# Patient Record
Sex: Male | Born: 1950 | Race: White | Hispanic: No | Smoking: Never smoker
Health system: Southern US, Community
[De-identification: ages and names within clinical notes are randomized; demographics above are authoritative.]

## PROBLEM LIST (undated history)

## (undated) DIAGNOSIS — S6291XA Unspecified fracture of right wrist and hand, initial encounter for closed fracture: Secondary | ICD-10-CM

## (undated) DIAGNOSIS — C801 Malignant (primary) neoplasm, unspecified: Secondary | ICD-10-CM

## (undated) DIAGNOSIS — I10 Essential (primary) hypertension: Secondary | ICD-10-CM

## (undated) DIAGNOSIS — Z8659 Personal history of other mental and behavioral disorders: Secondary | ICD-10-CM

## (undated) DIAGNOSIS — F319 Bipolar disorder, unspecified: Secondary | ICD-10-CM

## (undated) DIAGNOSIS — Z940 Kidney transplant status: Secondary | ICD-10-CM

## (undated) DIAGNOSIS — N19 Unspecified kidney failure: Secondary | ICD-10-CM

## (undated) HISTORY — PX: COLONOSCOPY: SHX174

## (undated) HISTORY — PX: SKIN CANCER EXCISION: SHX779

## (undated) HISTORY — PX: PARATHYROIDECTOMY: SHX19

---

## 2011-05-06 HISTORY — PX: KIDNEY TRANSPLANT: SHX239

## 2019-06-06 DEATH — deceased

## 2019-07-17 ENCOUNTER — Emergency Department (HOSPITAL_COMMUNITY): Payer: No Typology Code available for payment source

## 2019-07-17 ENCOUNTER — Emergency Department (HOSPITAL_COMMUNITY)
Admission: EM | Admit: 2019-07-17 | Discharge: 2019-07-17 | Disposition: A | Discharge status: Home / Self Care | Payer: No Typology Code available for payment source | Attending: Emergency Medicine | Admitting: Emergency Medicine

## 2019-07-17 DIAGNOSIS — S40212A Abrasion of left shoulder, initial encounter: Secondary | ICD-10-CM | POA: Insufficient documentation

## 2019-07-17 DIAGNOSIS — Y92410 Unspecified street and highway as the place of occurrence of the external cause: Secondary | ICD-10-CM | POA: Diagnosis not present

## 2019-07-17 DIAGNOSIS — Z23 Encounter for immunization: Secondary | ICD-10-CM | POA: Diagnosis not present

## 2019-07-17 DIAGNOSIS — S0081XA Abrasion of other part of head, initial encounter: Secondary | ICD-10-CM | POA: Diagnosis not present

## 2019-07-17 DIAGNOSIS — S62306A Unspecified fracture of fifth metacarpal bone, right hand, initial encounter for closed fracture: Secondary | ICD-10-CM | POA: Diagnosis not present

## 2019-07-17 DIAGNOSIS — S0031XA Abrasion of nose, initial encounter: Secondary | ICD-10-CM | POA: Diagnosis not present

## 2019-07-17 DIAGNOSIS — R519 Headache, unspecified: Secondary | ICD-10-CM | POA: Insufficient documentation

## 2019-07-17 DIAGNOSIS — S52501A Unspecified fracture of the lower end of right radius, initial encounter for closed fracture: Secondary | ICD-10-CM

## 2019-07-17 DIAGNOSIS — Y939 Activity, unspecified: Secondary | ICD-10-CM | POA: Diagnosis not present

## 2019-07-17 DIAGNOSIS — S6991XA Unspecified injury of right wrist, hand and finger(s), initial encounter: Secondary | ICD-10-CM | POA: Diagnosis present

## 2019-07-17 DIAGNOSIS — Y999 Unspecified external cause status: Secondary | ICD-10-CM | POA: Insufficient documentation

## 2019-07-17 DIAGNOSIS — S62304A Unspecified fracture of fourth metacarpal bone, right hand, initial encounter for closed fracture: Secondary | ICD-10-CM | POA: Diagnosis not present

## 2019-07-17 HISTORY — DX: Rider (driver) (passenger) of other motorcycle injured in unspecified traffic accident, initial encounter: V29.99XA

## 2019-07-17 MED ORDER — OXYCODONE-ACETAMINOPHEN 5-325 MG PO TABS
1.0000 | ORAL_TABLET | Freq: Once | ORAL | Status: AC
  Administered 2019-07-17: 1 via ORAL
  Filled 2019-07-17: qty 1

## 2019-07-17 MED ORDER — TETANUS-DIPHTH-ACELL PERTUSSIS 5-2.5-18.5 LF-MCG/0.5 IM SUSP
0.5000 mL | Freq: Once | INTRAMUSCULAR | Status: AC
  Administered 2019-07-17: 0.5 mL via INTRAMUSCULAR
  Filled 2019-07-17: qty 0.5

## 2019-07-17 MED ORDER — OXYCODONE-ACETAMINOPHEN 5-325 MG PO TABS: 1.0000 | ORAL_TABLET | Freq: Four times a day (QID) | ORAL | 0 refills | Status: DC | PRN

## 2019-07-17 MED ORDER — FENTANYL CITRATE (PF) 100 MCG/2ML IJ SOLN
100.0000 ug | Freq: Once | INTRAMUSCULAR | Status: AC
  Administered 2019-07-17: 100 ug via INTRAVENOUS
  Filled 2019-07-17: qty 2

## 2019-07-17 MED ORDER — HYDROCODONE-ACETAMINOPHEN 5-325 MG PO TABS
2.0000 | ORAL_TABLET | Freq: Once | ORAL | Status: DC
  Filled 2019-07-17: qty 2

## 2019-07-17 MED ORDER — BACITRACIN ZINC 500 UNIT/GM EX OINT
1.0000 "application " | TOPICAL_OINTMENT | Freq: Two times a day (BID) | CUTANEOUS | Status: DC
  Administered 2019-07-17: 1 via TOPICAL
  Filled 2019-07-17: qty 3.6
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
## 2019-07-17 NOTE — Progress Notes (Signed)
Orthopedic Tech Progress Note Patient Details:  Bradley Vargas 31-Dec-1950 [id number]  Ortho Devices Type of Ortho Device: Ulna gutter splint Ortho Device/Splint Location: LUE Ortho Device/Splint Interventions: Application, Ordered   Post Interventions Patient Tolerated: Well Instructions Provided: Care of device, Adjustment of device   Janit Pagan 07/17/2019, 7:32 PM

## 2019-07-17 NOTE — ED Notes (Signed)
Spoke with ortho tech. Reports they are at [facility] and should be finished there in about 20 minutes.
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
## 2019-07-18 ENCOUNTER — Other Ambulatory Visit (HOSPITAL_COMMUNITY)
Admission: RE | Admit: 2019-07-18 | Discharge: 2019-07-18 | Disposition: A | Discharge status: Home / Self Care | Payer: Medicare Other | Source: Ambulatory Visit | Attending: Plastic Surgery | Admitting: Plastic Surgery

## 2019-07-18 ENCOUNTER — Other Ambulatory Visit: Payer: Self-pay

## 2019-07-18 ENCOUNTER — Encounter (HOSPITAL_BASED_OUTPATIENT_CLINIC_OR_DEPARTMENT_OTHER): Payer: Self-pay | Admitting: Plastic Surgery

## 2019-07-18 ENCOUNTER — Encounter: Payer: Self-pay | Admitting: Plastic Surgery

## 2019-07-18 ENCOUNTER — Other Ambulatory Visit: Payer: Self-pay | Admitting: Plastic Surgery

## 2019-07-18 ENCOUNTER — Ambulatory Visit (INDEPENDENT_AMBULATORY_CARE_PROVIDER_SITE_OTHER): Payer: Medicare Other | Admitting: Plastic Surgery

## 2019-07-18 ENCOUNTER — Encounter (HOSPITAL_BASED_OUTPATIENT_CLINIC_OR_DEPARTMENT_OTHER)
Admission: RE | Admit: 2019-07-18 | Discharge: 2019-07-18 | Disposition: A | Discharge status: Home / Self Care | Payer: Medicare Other | Source: Ambulatory Visit

## 2019-07-18 VITALS — BP 169/80 | HR 61 | Temp 97.3°F | Ht 68.0 in | Wt 171.2 lb

## 2019-07-18 DIAGNOSIS — S52509A Unspecified fracture of the lower end of unspecified radius, initial encounter for closed fracture: Secondary | ICD-10-CM

## 2019-07-18 DIAGNOSIS — Z20822 Contact with and (suspected) exposure to covid-19: Secondary | ICD-10-CM | POA: Insufficient documentation

## 2019-07-18 DIAGNOSIS — Z01812 Encounter for preprocedural laboratory examination: Secondary | ICD-10-CM | POA: Diagnosis present

## 2019-07-18 LAB — BASIC METABOLIC PANEL
Anion gap: 8 (ref 5–15)
BUN: 22 mg/dL (ref 8–23)
CO2: 26 mmol/L (ref 22–32)
Calcium: 9 mg/dL (ref 8.9–10.3)
Chloride: 104 mmol/L (ref 98–111)
Creatinine, Ser: 1.2 mg/dL (ref 0.61–1.24)
GFR calc Af Amer: >60 mL/min (ref >60)
GFR calc non Af Amer: >60 mL/min (ref >60)
Glucose, Bld: 139 mg/dL — ABNORMAL HIGH (ref 70–99)
Potassium: 4.9 mmol/L (ref 3.5–5.1)
Sodium: 138 mmol/L (ref 135–145)

## 2019-07-18 LAB — SARS CORONAVIRUS 2 (TAT 6-24 HRS): SARS Coronavirus 2: NEGATIVE

## 2019-07-18 MED ORDER — OXYCODONE-ACETAMINOPHEN 5-325 MG PO TABS: 1.0000 | ORAL_TABLET | ORAL | 0 refills | Status: DC | PRN

## 2019-07-18 NOTE — Progress Notes (Signed)
EKG reviewed by Dr. Royce Macadamia and will proceed with surgery as scheduled.
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
## 2019-07-18 NOTE — Anesthesia Preprocedure Evaluation (Addendum)
Anesthesia Evaluation  Patient identified by MRN, date of birth, ID band Patient awake    Reviewed: Allergy & Precautions, NPO status , Patient's Chart, lab work & pertinent test results  Airway Mallampati: II  TM Distance: >3 FB Neck ROM: Full    Dental no notable dental hx. (+) Teeth Intact, Dental Advisory Given   Pulmonary neg pulmonary ROS,    Pulmonary exam normal breath sounds clear to auscultation       Cardiovascular hypertension, Normal cardiovascular exam Rhythm:Regular Rate:Normal     Neuro/Psych PSYCHIATRIC DISORDERS negative neurological ROS     GI/Hepatic negative GI ROS, Neg liver ROS,   Endo/Other  negative endocrine ROS  Renal/GU      Musculoskeletal negative musculoskeletal ROS (+)   Abdominal   Peds  Hematology negative hematology ROS (+)   Anesthesia Other Findings   Reproductive/Obstetrics                            Anesthesia Physical Anesthesia Plan  ASA: II  Anesthesia Plan: Regional   Post-op Pain Management:    Induction: Intravenous  PONV Risk Score and Plan: Treatment may vary due to age or medical condition, Ondansetron, Dexamethasone and Midazolam  Airway Management Planned: Nasal Cannula and Natural Airway  Additional Equipment: None  Intra-op Plan:   Post-operative Plan:   Informed Consent: I have reviewed the patients History and Physical, chart, labs and discussed the procedure including the risks, benefits and alternatives for the proposed anesthesia with the patient or authorized representative who has indicated his/her understanding and acceptance.     Dental advisory given  Plan Discussed with:   Anesthesia Plan Comments:        Anesthesia Quick Evaluation

## 2019-07-19 ENCOUNTER — Encounter (HOSPITAL_BASED_OUTPATIENT_CLINIC_OR_DEPARTMENT_OTHER)
Admission: RE | Disposition: A | Discharge status: Home / Self Care | Payer: Self-pay | Source: Home / Self Care | Attending: Plastic Surgery

## 2019-07-19 ENCOUNTER — Other Ambulatory Visit: Payer: Self-pay | Admitting: Plastic Surgery

## 2019-07-19 ENCOUNTER — Encounter (HOSPITAL_BASED_OUTPATIENT_CLINIC_OR_DEPARTMENT_OTHER): Payer: Self-pay | Admitting: Plastic Surgery

## 2019-07-19 ENCOUNTER — Ambulatory Visit (HOSPITAL_BASED_OUTPATIENT_CLINIC_OR_DEPARTMENT_OTHER): Payer: No Typology Code available for payment source | Admitting: Anesthesiology

## 2019-07-19 ENCOUNTER — Ambulatory Visit (HOSPITAL_BASED_OUTPATIENT_CLINIC_OR_DEPARTMENT_OTHER)
Admission: RE | Admit: 2019-07-19 | Discharge: 2019-07-19 | Disposition: A | Discharge status: Home / Self Care | Payer: No Typology Code available for payment source | Attending: Plastic Surgery | Admitting: Plastic Surgery

## 2019-07-19 ENCOUNTER — Other Ambulatory Visit: Payer: Self-pay

## 2019-07-19 DIAGNOSIS — I10 Essential (primary) hypertension: Secondary | ICD-10-CM | POA: Diagnosis not present

## 2019-07-19 DIAGNOSIS — Z79899 Other long term (current) drug therapy: Secondary | ICD-10-CM | POA: Insufficient documentation

## 2019-07-19 DIAGNOSIS — S62304A Unspecified fracture of fourth metacarpal bone, right hand, initial encounter for closed fracture: Secondary | ICD-10-CM | POA: Insufficient documentation

## 2019-07-19 DIAGNOSIS — S62306A Unspecified fracture of fifth metacarpal bone, right hand, initial encounter for closed fracture: Secondary | ICD-10-CM

## 2019-07-19 DIAGNOSIS — Z940 Kidney transplant status: Secondary | ICD-10-CM | POA: Diagnosis not present

## 2019-07-19 DIAGNOSIS — S52571A Other intraarticular fracture of lower end of right radius, initial encounter for closed fracture: Secondary | ICD-10-CM | POA: Insufficient documentation

## 2019-07-19 DIAGNOSIS — F319 Bipolar disorder, unspecified: Secondary | ICD-10-CM | POA: Insufficient documentation

## 2019-07-19 DIAGNOSIS — S52509A Unspecified fracture of the lower end of unspecified radius, initial encounter for closed fracture: Secondary | ICD-10-CM

## 2019-07-19 DIAGNOSIS — Z85828 Personal history of other malignant neoplasm of skin: Secondary | ICD-10-CM | POA: Diagnosis not present

## 2019-07-19 DIAGNOSIS — S52501A Unspecified fracture of the lower end of right radius, initial encounter for closed fracture: Secondary | ICD-10-CM

## 2019-07-19 HISTORY — DX: Bipolar disorder, unspecified: F31.9

## 2019-07-19 HISTORY — DX: Unspecified kidney failure: N19

## 2019-07-19 HISTORY — DX: Kidney transplant status: Z94.0

## 2019-07-19 HISTORY — DX: Essential (primary) hypertension: I10

## 2019-07-19 HISTORY — DX: Personal history of other mental and behavioral disorders: Z86.59

## 2019-07-19 HISTORY — DX: Malignant (primary) neoplasm, unspecified: C80.1

## 2019-07-19 HISTORY — PX: OPEN REDUCTION INTERNAL FIXATION (ORIF) DISTAL RADIAL FRACTURE: SHX5989

## 2019-07-19 HISTORY — DX: Unspecified fracture of right wrist and hand, initial encounter for closed fracture: S62.91XA

## 2019-07-19 SURGERY — OPEN REDUCTION INTERNAL FIXATION (ORIF) DISTAL RADIUS FRACTURE
Anesthesia: Regional | Site: Wrist | Laterality: Right

## 2019-07-19 MED ORDER — MIDAZOLAM HCL 2 MG/2ML IJ SOLN
1.0000 mg | INTRAMUSCULAR | Status: DC | PRN
  Administered 2019-07-19: 2 mg via INTRAVENOUS

## 2019-07-19 MED ORDER — LIDOCAINE 2% (20 MG/ML) 5 ML SYRINGE
INTRAMUSCULAR | Status: DC | PRN
  Administered 2019-07-19: 30 mg via INTRAVENOUS

## 2019-07-19 MED ORDER — ROPIVACAINE HCL 5 MG/ML IJ SOLN
INTRAMUSCULAR | Status: DC | PRN
  Administered 2019-07-19: 30 mL via PERINEURAL

## 2019-07-19 MED ORDER — ACETAMINOPHEN 500 MG PO TABS
ORAL_TABLET | ORAL | Status: AC
  Filled 2019-07-19: qty 2

## 2019-07-19 MED ORDER — ACETAMINOPHEN 10 MG/ML IV SOLN: 1000.0000 mg | Freq: Once | INTRAVENOUS | Status: DC | PRN

## 2019-07-19 MED ORDER — CLONIDINE HCL (ANALGESIA) 100 MCG/ML EP SOLN
EPIDURAL | Status: DC | PRN
  Administered 2019-07-19: 100 ug

## 2019-07-19 MED ORDER — ACETAMINOPHEN 500 MG PO TABS: 1000.0000 mg | ORAL_TABLET | Freq: Once | ORAL | Status: DC

## 2019-07-19 MED ORDER — MIDAZOLAM HCL 2 MG/2ML IJ SOLN
INTRAMUSCULAR | Status: AC
  Filled 2019-07-19: qty 2

## 2019-07-19 MED ORDER — LIDOCAINE-EPINEPHRINE 1 %-1:100000 IJ SOLN
INTRAMUSCULAR | Status: AC
  Filled 2019-07-19: qty 1

## 2019-07-19 MED ORDER — ONDANSETRON HCL 4 MG/2ML IJ SOLN: 4.0000 mg | Freq: Once | INTRAMUSCULAR | Status: DC | PRN

## 2019-07-19 MED ORDER — FENTANYL CITRATE (PF) 100 MCG/2ML IJ SOLN
INTRAMUSCULAR | Status: AC
  Filled 2019-07-19: qty 2

## 2019-07-19 MED ORDER — PROPOFOL 500 MG/50ML IV EMUL
INTRAVENOUS | Status: DC | PRN
  Administered 2019-07-19: 75 ug/kg/min via INTRAVENOUS

## 2019-07-19 MED ORDER — CEFAZOLIN SODIUM-DEXTROSE 2-4 GM/100ML-% IV SOLN
2.0000 g | INTRAVENOUS | Status: AC
  Administered 2019-07-19: 2 g via INTRAVENOUS

## 2019-07-19 MED ORDER — FENTANYL CITRATE (PF) 100 MCG/2ML IJ SOLN: 25.0000 ug | INTRAMUSCULAR | Status: DC | PRN

## 2019-07-19 MED ORDER — LACTATED RINGERS IV SOLN: INTRAVENOUS | Status: DC

## 2019-07-19 MED ORDER — CEFAZOLIN SODIUM-DEXTROSE 2-4 GM/100ML-% IV SOLN
INTRAVENOUS | Status: AC
  Filled 2019-07-19: qty 100

## 2019-07-19 MED ORDER — FENTANYL CITRATE (PF) 100 MCG/2ML IJ SOLN
50.0000 ug | INTRAMUSCULAR | Status: DC | PRN
  Administered 2019-07-19: 100 ug via INTRAVENOUS

## 2019-07-19 SURGICAL SUPPLY — 84 items
BAG DECANTER FOR FLEXI CONT (MISCELLANEOUS) IMPLANT
BIT DRILL 2.2 SS TIBIAL (BIT) ×2 IMPLANT
BLADE MINI RND TIP GREEN BEAV (BLADE) IMPLANT
BLADE SURG 15 STRL LF DISP TIS (BLADE) ×2 IMPLANT
BLADE SURG 15 STRL SS (BLADE) ×4
BNDG COHESIVE 1X5 TAN STRL LF (GAUZE/BANDAGES/DRESSINGS) IMPLANT
BNDG ELASTIC 3X5.8 VLCR STR LF (GAUZE/BANDAGES/DRESSINGS) IMPLANT
BNDG ELASTIC 4X5.8 VLCR STR LF (GAUZE/BANDAGES/DRESSINGS) IMPLANT
BNDG ESMARK 4X9 LF (GAUZE/BANDAGES/DRESSINGS) ×2 IMPLANT
BNDG GAUZE ELAST 4 BULKY (GAUZE/BANDAGES/DRESSINGS) IMPLANT
CHLORAPREP W/TINT 26 (MISCELLANEOUS) ×4 IMPLANT
CORD BIPOLAR FORCEPS 12FT (ELECTRODE) ×2 IMPLANT
COVER BACK TABLE 60X90IN (DRAPES) ×4 IMPLANT
COVER MAYO STAND STRL (DRAPES) ×4 IMPLANT
COVER WAND RF STERILE (DRAPES) IMPLANT
CUFF TOURN SGL QUICK 18X4 (TOURNIQUET CUFF) IMPLANT
DECANTER SPIKE VIAL GLASS SM (MISCELLANEOUS) IMPLANT
DRAPE EXTREMITY T 121X128X90 (DISPOSABLE) ×4 IMPLANT
DRAPE OEC MINIVIEW 54X84 (DRAPES) ×4 IMPLANT
DRAPE SURG 17X23 STRL (DRAPES) ×4 IMPLANT
DRSG PAD ABDOMINAL 8X10 ST (GAUZE/BANDAGES/DRESSINGS) IMPLANT
GAUZE SPONGE 4X4 12PLY STRL (GAUZE/BANDAGES/DRESSINGS) ×4 IMPLANT
GAUZE XEROFORM 1X8 LF (GAUZE/BANDAGES/DRESSINGS) ×4 IMPLANT
GLOVE BIO SURGEON STRL SZ 6.5 (GLOVE) ×2 IMPLANT
GLOVE BIO SURGEON STRL SZ7.5 (GLOVE) ×2 IMPLANT
GLOVE BIO SURGEONS STRL SZ 6.5 (GLOVE) ×2
GLOVE BIOGEL M STRL SZ7.5 (GLOVE) ×4 IMPLANT
GLOVE BIOGEL PI IND STRL 6.5 (GLOVE) IMPLANT
GLOVE BIOGEL PI IND STRL 8 (GLOVE) ×2 IMPLANT
GLOVE BIOGEL PI INDICATOR 6.5 (GLOVE) ×4
GLOVE BIOGEL PI INDICATOR 8 (GLOVE) ×2
GOWN STRL REUS W/ TWL LRG LVL3 (GOWN DISPOSABLE) ×4 IMPLANT
GOWN STRL REUS W/TWL LRG LVL3 (GOWN DISPOSABLE) ×16
K-WIRE 1.6 (WIRE) ×8
K-WIRE FX5X1.6XNS BN SS (WIRE) ×4
KWIRE FX5X1.6XNS BN SS (WIRE) IMPLANT
NDL HYPO 25X1 1.5 SAFETY (NEEDLE) IMPLANT
NDL PRECISIONGLIDE 27X1.5 (NEEDLE) IMPLANT
NDL SAFETY ECLIPSE 18X1.5 (NEEDLE) IMPLANT
NEEDLE HYPO 18GX1.5 SHARP (NEEDLE)
NEEDLE HYPO 25X1 1.5 SAFETY (NEEDLE) ×4 IMPLANT
NEEDLE PRECISIONGLIDE 27X1.5 (NEEDLE) IMPLANT
NS IRRIG 1000ML POUR BTL (IV SOLUTION) ×2 IMPLANT
PACK BASIN DAY SURGERY FS (CUSTOM PROCEDURE TRAY) ×4 IMPLANT
PAD CAST 3X4 CTTN HI CHSV (CAST SUPPLIES) IMPLANT
PAD CAST 4YDX4 CTTN HI CHSV (CAST SUPPLIES) IMPLANT
PADDING CAST COTTON 3X4 STRL (CAST SUPPLIES)
PADDING CAST COTTON 4X4 STRL (CAST SUPPLIES)
PEG LOCKING SMOOTH 2.2X16 (Screw) ×2 IMPLANT
PEG LOCKING SMOOTH 2.2X18 (Peg) ×6 IMPLANT
PEG LOCKING SMOOTH 2.2X20 (Screw) ×2 IMPLANT
PLATE DVR CROSSLOCK STD RT (Plate) ×2 IMPLANT
SCREW 2.7X12MM (Screw) ×2 IMPLANT
SCREW 2.7X14MM (Screw) ×2 IMPLANT
SCREW BN 14X2.7XNONLOCK 3 LD (Screw) IMPLANT
SCREW LOCK 16X2.7X 3 LD TPR (Screw) IMPLANT
SCREW LOCK 20X2.7X 3 LD TPR (Screw) IMPLANT
SCREW LOCKING 2.7X15MM (Screw) ×2 IMPLANT
SCREW LOCKING 2.7X16 (Screw) ×4 IMPLANT
SCREW LOCKING 2.7X20MM (Screw) ×4 IMPLANT
SCREW LP NL 2.7X22MM (Screw) ×2 IMPLANT
SCREW MULTI DIRECTIONAL 2.7X18 (Screw) ×2 IMPLANT
SCREW MULTI DIRECTIONAL 2.7X20 (Screw) ×2 IMPLANT
SCREW NLOCK 2.7X14 (Screw) ×2 IMPLANT
SLEEVE SCD COMPRESS KNEE MED (MISCELLANEOUS) ×2 IMPLANT
SPLINT PLASTER CAST XFAST 3X15 (CAST SUPPLIES) IMPLANT
SPLINT PLASTER XTRA FASTSET 3X (CAST SUPPLIES)
STOCKINETTE 4X48 STRL (DRAPES) ×4 IMPLANT
SUT CHROMIC 5 0 P 3 (SUTURE) IMPLANT
SUT ETHILON 4 0 PS 2 18 (SUTURE) IMPLANT
SUT FIBERWIRE 4-0 18 DIAM BLUE (SUTURE)
SUT MERSILENE 4 0 P 3 (SUTURE) IMPLANT
SUT MNCRL AB 4-0 PS2 18 (SUTURE) ×2 IMPLANT
SUT PROLENE 5 0 P 3 (SUTURE) IMPLANT
SUT SILK 4 0 PS 2 (SUTURE) IMPLANT
SUT SUPRAMID 4-0 (SUTURE) IMPLANT
SUT VIC AB 4-0 P-3 18XBRD (SUTURE) IMPLANT
SUT VIC AB 4-0 P3 18 (SUTURE)
SUT VICRYL 4-0 PS2 18IN ABS (SUTURE) IMPLANT
SUTURE FIBERWR 4-0 18 DIA BLUE (SUTURE) IMPLANT
SYR BULB 3OZ (MISCELLANEOUS) ×2 IMPLANT
SYR CONTROL 10ML LL (SYRINGE) IMPLANT
TOWEL GREEN STERILE FF (TOWEL DISPOSABLE) ×4 IMPLANT
UNDERPAD 30X36 HEAVY ABSORB (UNDERPADS AND DIAPERS) ×4 IMPLANT

## 2019-07-19 NOTE — Anesthesia Procedure Notes (Signed)
Anesthesia Regional Block: Supraclavicular block   Pre-Anesthetic Checklist: ,, timeout performed, Correct Patient, Correct Site, Correct Laterality, Correct Procedure, Correct Position, site marked, Risks and benefits discussed,  Surgical consent,  Pre-op evaluation,  At surgeon's request and post-op pain management  Laterality: Right  Prep: chloraprep       Needles:  Injection technique: Single-shot  Needle Type: Echogenic Needle     Needle Length: 5cm  Needle Gauge: 21     Additional Needles:   Procedures:,,,, ultrasound used (permanent image in chart),,,,  Narrative:  Start time: 07/19/2019 12:34 PM End time: 07/19/2019 12:40 PM Injection made incrementally with aspirations every 5 mL.  Performed by: Personally  Anesthesiologist: Barnet Glasgow, MD  Additional Notes: Pt tolerates procedure well

## 2019-07-19 NOTE — Anesthesia Procedure Notes (Signed)
Procedure Name: MAC Date/Time: 07/19/2019 1:44 PM Performed by: Signe Colt, CRNA Pre-anesthesia Checklist: Patient identified, Emergency Drugs available, Suction available, Patient being monitored and Timeout performed Patient Re-evaluated:Patient Re-evaluated prior to induction Oxygen Delivery Method: Simple face mask

## 2019-07-19 NOTE — Discharge Instructions (Addendum)
Activity As tolerated: NO showers unless you are able to cover entire arm with a waterproof bag. Avoid getting arm or splint wet. Elevate as able.  NO driving No heavy activities, no heavy lifting, avoid using your right arm/hand. Keep sling on until you have normal movement of your right shoulder.  Diet: Regular  Wound Care: Keep dressing clean & dry. Do not change dressings  Special Instructions Call Doctor if any unusual problems occur such as pain, excessive Bleeding, unrelieved Nausea/vomiting, Fever &/or chills  Office will coordinate hand therapy follow up for you to have a custom splint made. Plan to follow up in 2 weeks with Dr. Claudia Desanctis 3/24 or 3/25. Please call office to schedule.  You will need to have x-rays of your hand/wrist prior to that visit to check your healing process. These will be ordered for you to have done at [facility]. Call the office at [phone] if you have any concerns or questions.  Post Anesthesia Home Care Instructions  Activity: Get plenty of rest for the remainder of the day. A responsible individual must stay with you for 24 hours following the procedure.  For the next 24 hours, DO NOT: -Drive a car -Paediatric nurse -Drink alcoholic beverages -Take any medication unless instructed by your physician -Make any legal decisions or sign important papers.  Meals: Start with liquid foods such as gelatin or soup. Progress to regular foods as tolerated. Avoid greasy, spicy, heavy foods. If nausea and/or vomiting occur, drink only clear liquids until the nausea and/or vomiting subsides. Call your physician if vomiting continues.  Special Instructions/Symptoms: Your throat may feel dry or sore from the anesthesia or the breathing tube placed in your throat during surgery. If this causes discomfort, gargle with warm salt water. The discomfort should disappear within 24 hours.  If you had a scopolamine patch placed behind your ear for the management of post-  operative nausea and/or vomiting:  1. The medication in the patch is effective for 72 hours, after which it should be removed.  Wrap patch in a tissue and discard in the trash. Wash hands thoroughly with soap and water. 2. You may remove the patch earlier than 72 hours if you experience unpleasant side effects which may include dry mouth, dizziness or visual disturbances. 3. Avoid touching the patch. Wash your hands with soap and water after contact with the patch.    Regional Anesthesia Blocks  1. Numbness or the inability to move the "blocked" extremity may last from 3-48 hours after placement. The length of time depends on the medication injected and your individual response to the medication. If the numbness is not going away after 48 hours, call your surgeon.  2. The extremity that is blocked will need to be protected until the numbness is gone and the  Strength has returned. Because you cannot feel it, you will need to take extra care to avoid injury. Because it may be weak, you may have difficulty moving it or using it. You may not know what position it is in without looking at it while the block is in effect.  3. For blocks in the legs and feet, returning to weight bearing and walking needs to be done carefully. You will need to wait until the numbness is entirely gone and the strength has returned. You should be able to move your leg and foot normally before you try and bear weight or walk. You will need someone to be with you when you first try to  ensure you do not fall and possibly risk injury.  4. Bruising and tenderness at the needle site are common side effects and will resolve in a few days.  5. Persistent numbness or new problems with movement should be communicated to the surgeon or the [facility] [phone] [place] [phone]).

## 2019-07-19 NOTE — Anesthesia Postprocedure Evaluation (Signed)
Anesthesia Post Note  Patient: Bradley Vargas  Procedure(s) Performed: OPEN REDUCTION INTERNAL FIXATION (ORIF) DISTAL RADIAL FRACTURE (Right Wrist)     Patient location during evaluation: PACU Anesthesia Type: Regional Level of consciousness: awake and alert Pain management: pain level controlled Vital Signs Assessment: post-procedure vital signs reviewed and stable Respiratory status: spontaneous breathing, nonlabored ventilation, respiratory function stable and patient connected to nasal cannula oxygen Cardiovascular status: stable and blood pressure returned to baseline Postop Assessment: no apparent nausea or vomiting Anesthetic complications: no    Last Vitals:  Vitals:   07/19/19 1245 07/19/19 1525  BP: 133/78 (!) 87/50  Pulse: (!) 53 (!) 45  Resp: 16   Temp:  (!) 36.4 C  SpO2: 100% 100%    Last Pain:  Vitals:   07/19/19 1530  TempSrc:   PainSc: 0-No pain                 Barnet Glasgow

## 2019-07-19 NOTE — Progress Notes (Signed)
Assisted Dr. Houser with right, ultrasound guided, supraclavicular block. Side rails up, monitors on throughout procedure. See vital signs in flow sheet. Tolerated Procedure well. 

## 2019-07-19 NOTE — Transfer of Care (Signed)
Immediate Anesthesia Transfer of Care Note  Patient: Bradley Vargas  Procedure(s) Performed: OPEN REDUCTION INTERNAL FIXATION (ORIF) DISTAL RADIAL FRACTURE (Right Wrist)  Patient Location: PACU  Anesthesia Type:MAC combined with regional for post-op pain  Level of Consciousness: drowsy and patient cooperative  Airway & Oxygen Therapy: Patient Spontanous Breathing and Patient connected to face mask oxygen  Post-op Assessment: Report given to RN and Post -op Vital signs reviewed and stable  Post vital signs: Reviewed and stable  Last Vitals:  Vitals Value Taken Time  BP    Temp    Pulse 45 07/19/19 1525  Resp    SpO2 100 % 07/19/19 1525  Vitals shown include unvalidated device data.  Last Pain:  Vitals:   07/19/19 1217  TempSrc: Oral  PainSc: 3          Complications: No apparent anesthesia complications

## 2019-07-19 NOTE — Interval H&P Note (Signed)
History and Physical Interval Note:  07/19/2019 1:21 PM  Bradley Vargas  has presented today for surgery, with the diagnosis of [place] HAND AND RADIUS.  The various methods of treatment have been discussed with the patient and family. After consideration of risks, benefits and other options for treatment, the patient has consented to  Procedure(s): OPEN REDUCTION INTERNAL FIXATION (ORIF) DISTAL RADIAL FRACTURE (Right) as a surgical intervention.  The patient's history has been reviewed, patient examined, no change in status, stable for surgery.  I have reviewed the patient's chart and labs.  Questions were answered to the patient's satisfaction.     Cindra Presume

## 2019-07-19 NOTE — Op Note (Signed)
Operative Note   DATE OF OPERATION: 07/19/2019  SURGICAL DEPARTMENT: Plastic Surgery  PREOPERATIVE DIAGNOSES: 1.  Displaced right intra-articular distal radius fracture 2.  Minimally displaced fourth and fifth metacarpal fractures  POSTOPERATIVE DIAGNOSES:  same  PROCEDURE: 1.  Open reduction internal fixation of right intra-articular distal radius fracture with 2 fracture segments 2.  Closed treatment without manipulation of minimally displaced fourth and fifth metacarpal fractures  SURGEON: Talmadge Coventry, MD  ASSISTANT: Verdie Shire, PA The advanced practice practitioner (APP) assisted throughout the case.  The APP was essential in retraction and counter traction when needed to make the case progress smoothly.  This retraction and assistance made it possible to see the tissue plans for the procedure.  The assistance was needed for blood control, tissue re-approximation and assisted with closure of the incision site.  ANESTHESIA:  General.   COMPLICATIONS: None.   INDICATIONS FOR PROCEDURE:  The patient, Bradley Vargas is a 69 y.o. male born on 1951-02-07, is here for treatment of displaced intra-articular distal radius fracture with a radial styloid component.  He also has minimally displaced metacarpal fractures that I am going evaluate intraoperatively and determine appropriate treatment. MRN: [id number]  CONSENT:  Informed consent was obtained directly from the patient. Risks, benefits and alternatives were fully discussed. Specific risks including but not limited to bleeding, infection, hematoma, seroma, scarring, pain, contracture, asymmetry, wound healing problems, and need for further surgery were all discussed. The patient did have an ample opportunity to have questions answered to satisfaction.   DESCRIPTION OF PROCEDURE:  The patient was taken to the operating room. SCDs were placed and Ancef antibiotics were given.  Regional anesthesia was administered.  The  patient's operative site was prepped and draped in a sterile fashion. A time out was performed and all information was confirmed to be correct.  I started by exsanguinating the arm with Esmarch inflated the tourniquet to 250 mmHg.  Incision was made over the FCR tendon.  I dissected down to it with tenotomy's.  The FCR was then retracted ulnarly and the subsheath was incised.  I then bluntly dissected down to the pronator quadratus.  The pronator quadratus was incised along the radial border of distal radius and elevated ulnarly.  The fracture segment was readily identified as a radial styloid fracture that was displaced volarly.  A standard with [employer] right sided distal radius plate was chosen.  This was checked and was the appropriate width and length for this fracture.  Placing it up against the volar cortex buttress to the radial styloid fragment back in the position.  I first secured the plate in appropriate position using K wires proximally and distally.  I then placed a non locking screw in the oblong hole proximally.  I then placed a cortical screw distally to compress the plate down into the volar cortex.  This was followed by placing a combination of nonlocking screws and smooth pegs into the other holes distally.  I took care to appropriately positioned the screws going into the radial styloid fragment.  I then placed 2 locking screws proximally.  It was checked on multiple views to ensure appropriate placement of the screws and pegs so that they were penetrating the dorsal cortex and not penetrating the articular surface.  He maintained the fracture reduction nicely.  Tourniquet was then let down hemostasis was obtained.  Closure was done on the skin only with combination of interrupted buried and running 4-0 Monocryl sutures.  I then closely examined  the metacarpal fractures.  There were fragmented comminuted fractures of the fourth and fifth metacarpals however alignment was essentially  anatomic.  I elected to splint these without surgical fixation.  At this point a soft wrap and a volar splint was applied.  Tourniquet time was 67 minutes.  The patient tolerated the procedure well.  There were no complications. The patient was allowed to wake from anesthesia, extubated and taken to the recovery room in satisfactory condition.

## 2019-07-19 NOTE — Brief Op Note (Signed)
07/19/2019  3:40 PM  PATIENT:  Bradley Vargas  69 y.o. male  PRE-OPERATIVE DIAGNOSIS:  MULTIPLE FRACTURES OF HAND,CLOSED FRATUREOF RIGHT HAND AND RADIUS  POST-OPERATIVE DIAGNOSIS:  MULTIPLE FRACTURES OF HAND,CLOSED FRATUREOF RIGHT HAND AND RADIUS  PROCEDURE:  Procedure(s): OPEN REDUCTION INTERNAL FIXATION (ORIF) DISTAL RADIAL FRACTURE (Right)  SURGEON:  Surgeon(s) and Role:    * Lucienne Sawyers, Steffanie Dunn, MD - Primary  PHYSICIAN ASSISTANT: Software engineer, PA  ASSISTANTS: none   ANESTHESIA:   local and regional  EBL:  25   BLOOD ADMINISTERED:none  DRAINS: none   LOCAL MEDICATIONS USED:  XYLOCAINE   SPECIMEN:  No Specimen  DISPOSITION OF SPECIMEN:  N/A  COUNTS:  YES  TOURNIQUET:   Total Tourniquet Time Documented: Upper Arm (Right) - 67 minutes Total: Upper Arm (Right) - 67 minutes   DICTATION: .Viviann Spare Dictation  PLAN OF CARE: Discharge to home after PACU  PATIENT DISPOSITION:  PACU - hemodynamically stable.   Delay start of Pharmacological VTE agent (>24hrs) due to surgical blood loss or risk of bleeding: not applicable

## 2019-07-21 ENCOUNTER — Encounter: Payer: Self-pay | Admitting: *Deleted

## 2019-07-27 ENCOUNTER — Encounter: Payer: Medicare Other | Admitting: Plastic Surgery

## 2019-07-27 ENCOUNTER — Telehealth: Payer: Self-pay

## 2019-07-27 NOTE — Telephone Encounter (Signed)
Signed OT plan of care and faxed to [facility]

## 2019-08-01 ENCOUNTER — Encounter: Payer: Self-pay | Admitting: Plastic Surgery

## 2019-08-01 ENCOUNTER — Ambulatory Visit (INDEPENDENT_AMBULATORY_CARE_PROVIDER_SITE_OTHER): Payer: Medicare Other | Admitting: Plastic Surgery

## 2019-08-01 ENCOUNTER — Other Ambulatory Visit: Payer: Self-pay

## 2019-08-01 ENCOUNTER — Ambulatory Visit (HOSPITAL_COMMUNITY)
Admission: RE | Admit: 2019-08-01 | Discharge: 2019-08-01 | Disposition: A | Discharge status: Home / Self Care | Payer: Medicare Other | Source: Ambulatory Visit | Attending: Plastic Surgery | Admitting: Plastic Surgery

## 2019-08-01 VITALS — BP 156/82 | HR 59 | Temp 97.3°F | Ht 68.0 in | Wt 171.0 lb

## 2019-08-01 DIAGNOSIS — S52509A Unspecified fracture of the lower end of unspecified radius, initial encounter for closed fracture: Secondary | ICD-10-CM

## 2019-08-01 MED ORDER — OXYCODONE-ACETAMINOPHEN 5-325 MG PO TABS: 1.0000 | ORAL_TABLET | ORAL | 0 refills | Status: DC | PRN

## 2019-08-01 NOTE — Progress Notes (Signed)
Patient is here 2 weeks postop from right distal radius fracture open reduction internal fixation with volar plate and closed treatment of a ring finger metacarpal fracture that was nondisplaced.  He is doing fine and his pain is controlled.  He has had an x-ray which shows good positioning of the plate with appropriate alignment of the distal radius and maintained alignment of the fourth metacarpal.  He has been to see therapy and has a splint in place.  I then asked them to modify this so he can have his index and long fingers free while still immobilizing the small and ring finger for the fourth metacarpal fracture.  I would like him to wear the splint for another 2 weeks and then he can begin to wean himself out of it.  I would like him to avoid any strenuous activity at least until I see him back which I will do in 3 weeks time.  At that point I will get him another x-ray of the hand and wrist so that we can evaluate how things are progressing.  Patient is here with his son and all of his questions were answered.  I Georgina Peer give him 1 more refill on his pain medication as he requested but I explained that I would be the last prescription I be able to write him for that.

## 2019-08-15 ENCOUNTER — Telehealth: Payer: Self-pay

## 2019-08-15 NOTE — Telephone Encounter (Signed)
Faxed signed orders to [facility]

## 2019-08-24 ENCOUNTER — Encounter: Payer: Self-pay | Admitting: Plastic Surgery

## 2019-08-24 ENCOUNTER — Other Ambulatory Visit: Payer: Self-pay

## 2019-08-24 ENCOUNTER — Ambulatory Visit (INDEPENDENT_AMBULATORY_CARE_PROVIDER_SITE_OTHER): Payer: Medicare Other | Admitting: Plastic Surgery

## 2019-08-24 ENCOUNTER — Ambulatory Visit (HOSPITAL_COMMUNITY)
Admission: RE | Admit: 2019-08-24 | Discharge: 2019-08-24 | Disposition: A | Discharge status: Home / Self Care | Payer: Medicare Other | Source: Ambulatory Visit | Attending: Plastic Surgery | Admitting: Plastic Surgery

## 2019-08-24 VITALS — BP 159/89 | HR 71 | Temp 97.8°F | Ht 68.0 in | Wt 171.0 lb

## 2019-08-24 DIAGNOSIS — S52509A Unspecified fracture of the lower end of unspecified radius, initial encounter for closed fracture: Secondary | ICD-10-CM | POA: Diagnosis not present

## 2019-08-24 NOTE — Progress Notes (Signed)
Patient is postop from a distal radius fracture repair.  He is doing well with no complaints.  On exam he has good range of motion in his fingers.  His incision is healed fine.  He has normal sensation in his hand.  He reports taking his splint off intermittently and is not bothered by it.  I did get an x-ray which shows appropriate placement of the plate and normal alignment of the articular surface and fracture.  His metacarpal fractures are also healing nicely with good callus formation and appropriate alignment.  I told him he could mostly come out of the splint at this point.  I do not want him doing anything strenuous just yet.  He is here to work on strengthening of his hand and range of motion with the hand therapist.  I will plan to see him back in around 6 weeks.

## 2019-09-28 ENCOUNTER — Telehealth: Payer: Self-pay | Admitting: *Deleted

## 2019-09-28 NOTE — Telephone Encounter (Signed)
Received Plan of Care on (09/24/19) via of fax from [facility].  Requesting to be signed and returned.  Given to provider to complete.    Plan of Care completed and faxed on (09/26/19) to [facility].  Confirmation received.  Copy scanned into the chart.//AB/CMA

## 2019-10-12 ENCOUNTER — Encounter: Payer: Self-pay | Admitting: Plastic Surgery

## 2019-10-12 ENCOUNTER — Ambulatory Visit (INDEPENDENT_AMBULATORY_CARE_PROVIDER_SITE_OTHER): Payer: Medicare Other | Admitting: Plastic Surgery

## 2019-10-12 ENCOUNTER — Other Ambulatory Visit: Payer: Self-pay

## 2019-10-12 VITALS — BP 163/83 | HR 56 | Temp 97.5°F

## 2019-10-12 DIAGNOSIS — S52509A Unspecified fracture of the lower end of unspecified radius, initial encounter for closed fracture: Secondary | ICD-10-CM

## 2019-10-12 NOTE — Progress Notes (Signed)
Patient is here postop from an open reduction internal fixation of distal radius fracture.  He is overall very happy with his progress.  He is made quite a bit of progress in range of motion and strengthening with therapy.  He has been discharged from his physical therapist and is now doing a home exercise program for strengthening.  His only complaint is some diminished sensation in the median distribution on the operative side.  On exam everything is looking good.  His scar is well-healed.  He has good range of motion of his wrist and fingers.  He has normal thenar muscle function.  There is a bit of diminished sensation in the median distribution but it is not absent.  I expect this is a neuropraxia either from the initial trauma or intraoperative retraction.  I suspect this will go away with time.  I offered another follow-up appointment in a couple months but he wants to see how this goes and he will call us if he has any further questions or concerns.  Overall he is had a satisfactory outcome.  We will plan to see him again as needed.
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
